# Patient Record
Sex: Male | Born: 1960 | Race: White | Hispanic: No | Marital: Married
Health system: Southern US, Community
[De-identification: ages and names within clinical notes are randomized; demographics above are authoritative.]

---

## 2004-12-02 ENCOUNTER — Ambulatory Visit (HOSPITAL_COMMUNITY): Admission: RE | Admit: 2004-12-02 | Discharge: 2004-12-03 | Payer: Self-pay | Admitting: Cardiology

## 2013-05-02 ENCOUNTER — Inpatient Hospital Stay: Payer: Self-pay | Admitting: Internal Medicine

## 2013-05-02 LAB — CBC
HGB: 15.3 g/dL (ref 13.0–18.0)
MCH: 28.9 pg (ref 26.0–34.0)
MCHC: 34.2 g/dL (ref 32.0–36.0)
MCV: 85 fL (ref 80–100)
Platelet: 229 10*3/uL (ref 150–440)
RDW: 13.7 % (ref 11.5–14.5)

## 2013-05-02 LAB — COMPREHENSIVE METABOLIC PANEL
Anion Gap: 6 — ABNORMAL LOW (ref 7–16)
BUN: 16 mg/dL (ref 7–18)
Bilirubin,Total: 0.3 mg/dL (ref 0.2–1.0)
Chloride: 108 mmol/L — ABNORMAL HIGH (ref 98–107)
Co2: 24 mmol/L (ref 21–32)
Creatinine: 0.76 mg/dL (ref 0.60–1.30)
Osmolality: 278 (ref 275–301)
Sodium: 138 mmol/L (ref 136–145)

## 2013-05-02 LAB — CK TOTAL AND CKMB (NOT AT ARMC)
CK, Total: 105 U/L (ref 35–232)
CK-MB: 2 ng/mL (ref 0.5–3.6)

## 2013-05-02 LAB — TROPONIN I
Troponin-I: <0.02 ng/mL
Troponin-I: <0.02 ng/mL

## 2013-05-02 LAB — APTT: Activated PTT: 45.1 secs — ABNORMAL HIGH (ref 23.6–35.9)

## 2013-05-02 LAB — PRO B NATRIURETIC PEPTIDE: B-Type Natriuretic Peptide: 135 pg/mL — ABNORMAL HIGH (ref 0–125)

## 2013-05-03 LAB — CBC WITH DIFFERENTIAL/PLATELET
Basophil %: 0.9 %
Eosinophil #: 0.3 10*3/uL (ref 0.0–0.7)
HCT: 40.2 % (ref 40.0–52.0)
MCH: 28.7 pg (ref 26.0–34.0)
MCV: 85 fL (ref 80–100)
Monocyte #: 0.5 x10 3/mm (ref 0.2–1.0)
Monocyte %: 6.7 %
Neutrophil %: 70.6 %
Platelet: 198 10*3/uL (ref 150–440)
RDW: 13.8 % (ref 11.5–14.5)

## 2013-05-03 LAB — TROPONIN I: Troponin-I: <0.02 ng/mL

## 2013-05-03 LAB — BASIC METABOLIC PANEL
BUN: 12 mg/dL (ref 7–18)
Calcium, Total: 8.5 mg/dL (ref 8.5–10.1)
Chloride: 106 mmol/L (ref 98–107)
Glucose: 109 mg/dL — ABNORMAL HIGH (ref 65–99)
Osmolality: 280 (ref 275–301)
Potassium: 4.1 mmol/L (ref 3.5–5.1)

## 2013-05-03 LAB — LIPID PANEL: Ldl Cholesterol, Calc: 63 mg/dL (ref 0–100)

## 2014-07-07 IMAGING — CR DG CHEST 2V
1 series · 2 of 2 positions shown · non-contrast
Comparison: none

REASON FOR EXAM: Chest Pain
COMMENTS:

[Series 1: pa · 0.17mm/px · 2 of 2 slices shown]
[im 1/2]
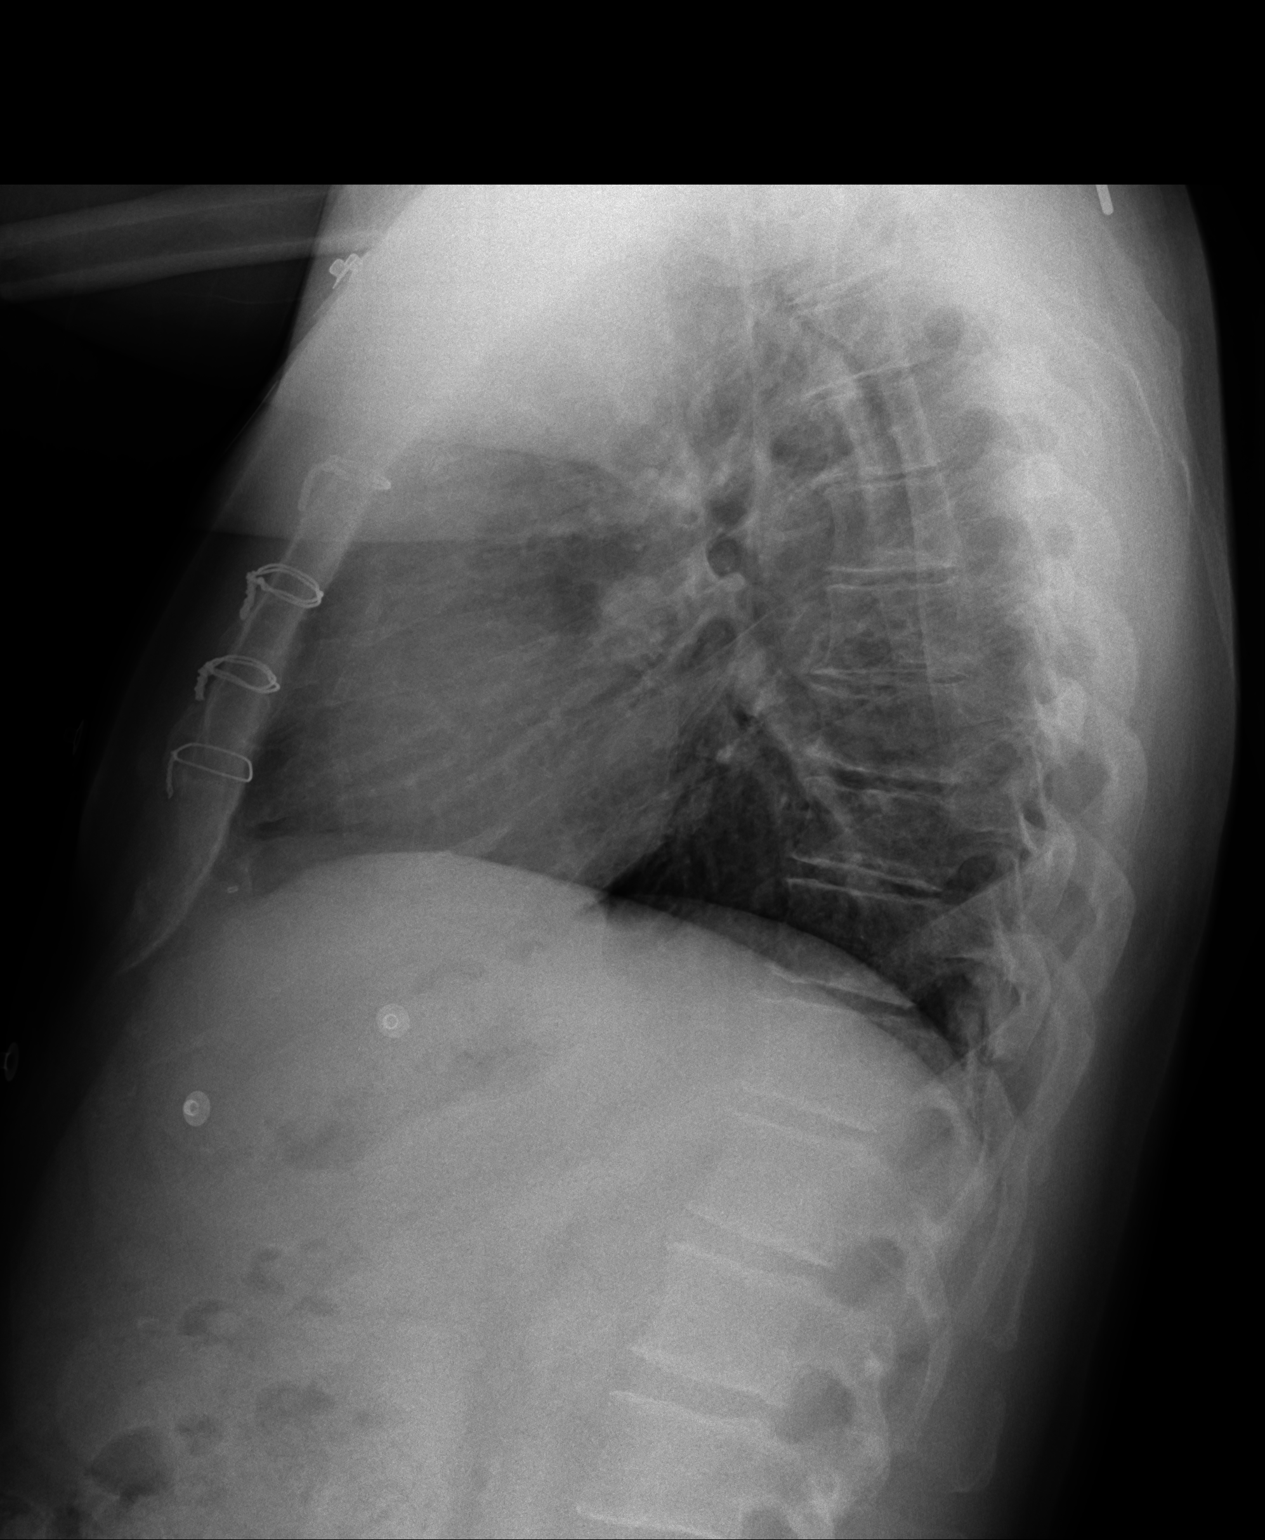
[im 2/2]
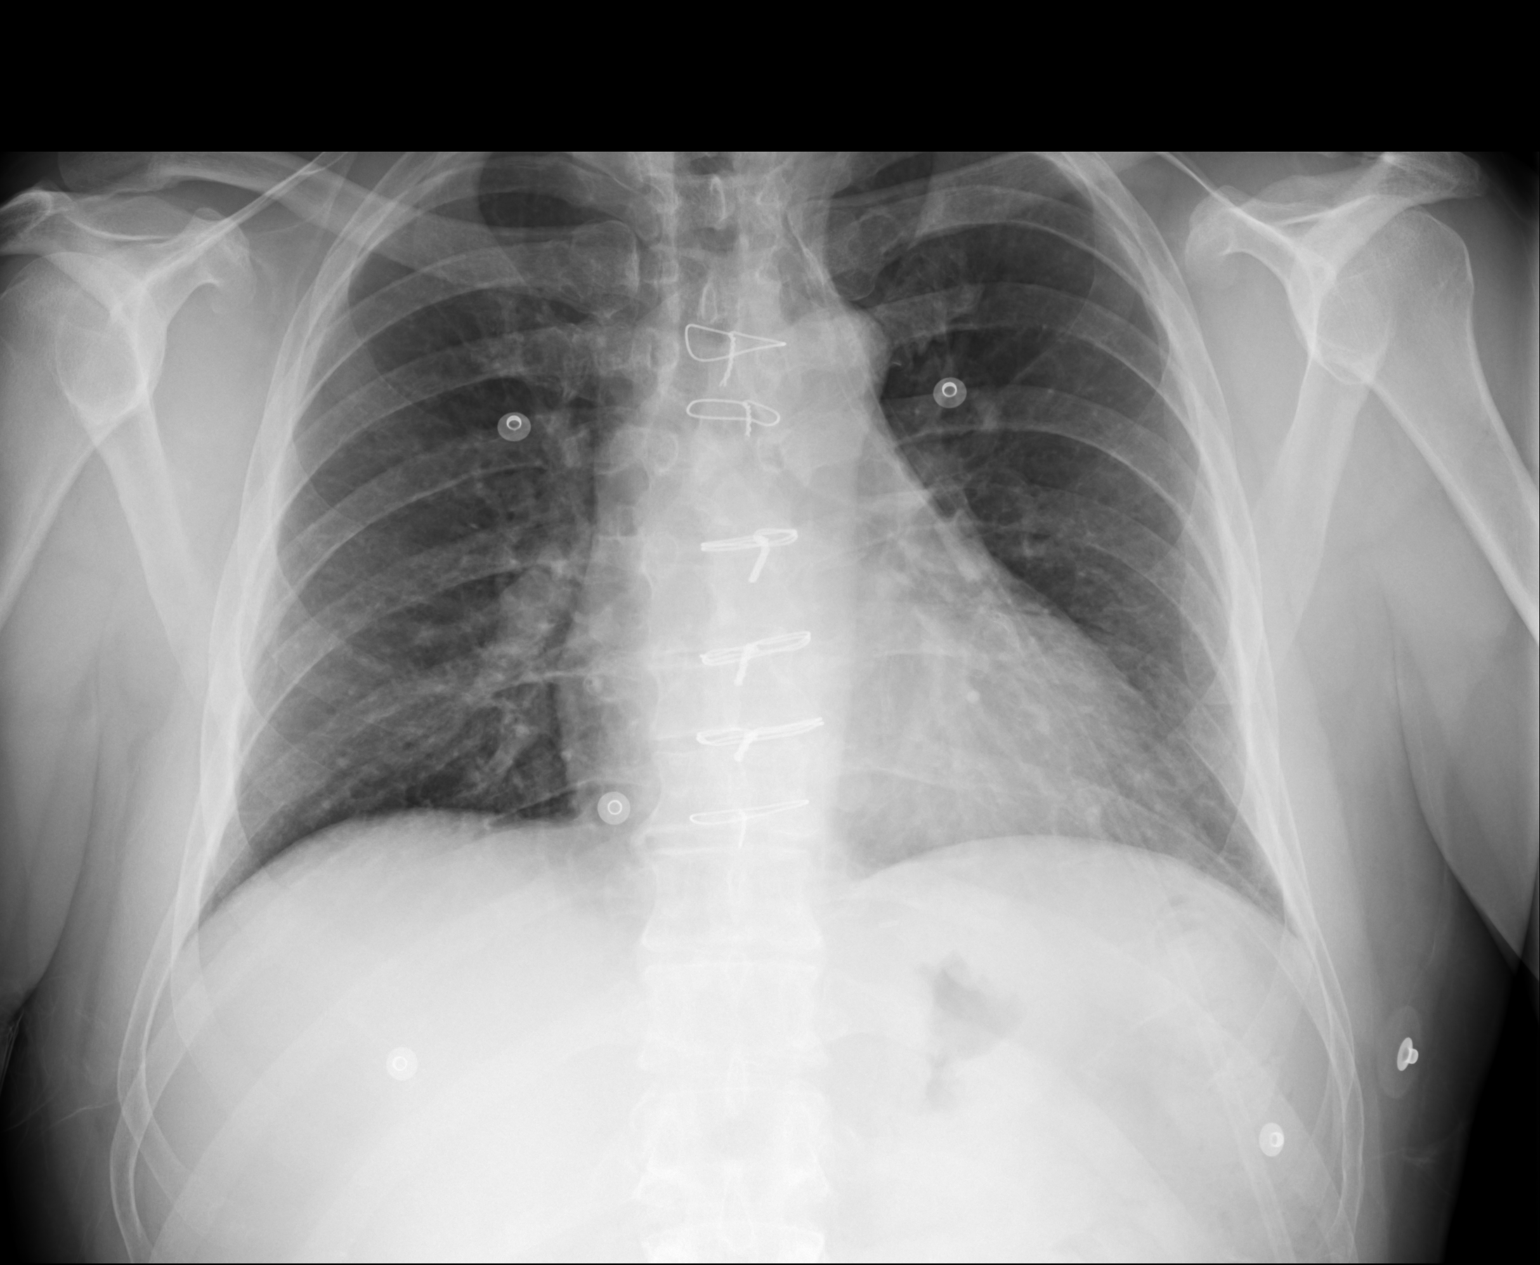

[2 of 2 positions shown; findings below may reference images not displayed]

PROCEDURE:     DXR - DXR CHEST PA (OR AP) AND LATERAL  - May 02, 2013 [DATE]

RESULT:     Sternotomy wires are present. The lungs are clear. The heart and
pulmonary vessels are normal. The bony and mediastinal structures are
unremarkable. There is no effusion. There is no pneumothorax or evidence of
congestive failure.
IMPRESSION: No acute cardiopulmonary disease.

[REDACTED]

## 2015-03-07 NOTE — Discharge Summary (Signed)
PATIENT NAME:  Micheal Patterson, Micheal Patterson MR#:  [id number] DATE OF BIRTH:  1961/06/01  DATE OF ADMISSION:  05/02/2013 DATE OF DISCHARGE:  05/03/2013  DISCHARGE DIAGNOSSS:  1.  Unable angina due to underlying coronary artery disease, now resolved.   2.  Coronary artery disease, status post cardiac catheterization with significant right coronary artery stenosis with drug-eluting stent placement of right coronary artery without any complications.    SECONDARY DIAGNOSES: 1.  Coronary artery disease.  2.  Hyperlipidemia.  3.  Diabetes.   CONSULTATION: Cardiology.   PROCEDURES/RADIOLOGY:  Chest x-ray on 18th of June showed no acute cardiopulmonary disease.  Cardiac catheterization on 18th of June showed known coronary artery disease with occluded graft to RCA, status post drug-eluting stent  in the 95% lesion of the distal RCA for intervention. There was 0% residue of stenosis. LIMA to LAD was patent. Graft to marginal was patent at the stent site. Left main had 80% stenosis.  New distal RCA stenosis was seen.   HISTORY AND SHORT HOSPITAL COURSE:  The patient is a 54 year old male with the above-mentioned medical problems who was admitted for unstable angina. Please see Dr. Mathews RobinsonsWieting's dictated  history and physical for further details. Cardiology consultation was obtained with Dr. Gwen PoundsKowalski, who recommended cardiac cath, considering his typical symptoms, which was performed, showed RCA stenosis and had a drug-eluting stent placed.  Post cath the patient was doing much better and was close to his baseline and was discharged home in stable condition.   PERTINENT DISCHARGE PHYSICAL EXAMINATION: VITAL SIGNS: On the date of discharge, his vital signs are as follows: Temperature 98.1, heart rate 56 per minute, respirations 18 per minute, blood pressure 100/55. He was saturating 97% on room air.  CARDIOVASCULAR: S1, S2 normal. No murmurs, rubs or gallops.  LUNGS: Clear to auscultation bilaterally. No wheezing, rales,  rhonchi or crepitation.  ABDOMEN: Soft, benign.  NEUROLOGIC: Nonfocal examination.  All other physical examination remained at baseline.   DISCHARGE MEDICATIONS: 1.  FEN 10 mg p.o. daily.  2.  Nexium 40 mg p.o. daily.  3.  Januvia 100 mg p.o. daily.  4.  Lisinopril 2.5 mg p.o. daily.  5.  Hyoscyamine 0.375 mg p.o. daily.  6.  Metformin 1000 mg p.o. b.i.d.  7.  Crestor 20 mg p.o. at bedtime.  8.  Fenofibrate acid 45 mg p.o. daily.  9.  Aspirin 81 mg p.o. daily.  10.  Metoprolol 12.5 mg p.o. daily.   DISCHARGE INSTRUCTIONS AND FOLLOWUP:   Diet:  Low sodium, low fat, low cholesterol.  Activity: As tolerated.  Follow up:  The patient was instructed to follow up with his primary care physician, Dr. Achilles Dunkope, in [place], [place] in 1 to 2 weeks.  He will need follow up with Dr. Renae FicklePaul at [facility] in [place], [place],  who is his regular cardiologist in 2 to 4 weeks.  He will also need followup with GI physician as planned for his EGD in 2 to 4 weeks.   TOTAL TIME DISCHARGING THIS PATIENT: 55 minutes.  ____________________________ Ellamae SiaVipul S. Sherryll BurgerShah, MD vss:cb D: 05/06/2013 15:15:28 ET T: 05/06/2013 20:20:01 ET JOB#: [id number] cc: Zitlaly Malson S. Sherryll BurgerShah, MD, <Dictator> Dr. Achilles [place]ope in [place], [place] Dr. Renae FicklePaul at [facility] in [place], [place] Lamar BlinksBruce J. Kowalski, MD Ellamae SiaVIPUL S [facility] MD ELECTRONICALLY SIGNED 05/07/2013 11:27

## 2015-03-07 NOTE — H&P (Signed)
PATIENT NAME:  Micheal Patterson, Micheal Patterson MR#:  [id number] DATE OF BIRTH:  1961-06-26  DATE OF ADMISSION:  05/02/2013  PRIMARY CARE PHYSICIAN:  Dr. Achilles Dunk in [place], [place]  CARDIOLOGIST:  Dr. Carter Kitten  in [facility] in [place], [place].   CHIEF COMPLAINT: Chest pain.   HISTORY OF PRESENT ILLNESS: This is a 54 year old man with coronary artery disease who presents with chest pain starting at 6:30 a.m. 7/10 in intensity in the center of his chest, radiating up into the right neck associated with diaphoresis. No nausea. No shortness of breath. The patient's chest pain was unrelenting and decided to call EMS. EMS gave 325 mg of aspirin and 2 nitroglycerin.  The pain did not relieve. They did an EKG which was concerning for a possible STEMI with ST elevations inferiorly. The patient arrived at the ER and EKG did not show a STEMI and the patient's initial troponin was negative. Hospitalist services were contacted for further evaluation. Of note, the patient did have a stent about a year ago and had a negative stress test last October. The patient is supposed to have an EGD on Monday for dysphagia to solids and liquids. The patient does not believe it is this type of chest pain related to his dysphagia.   PAST MEDICAL HISTORY: Coronary disease, hyperlipidemia, diabetes.   PAST SURGICAL HISTORY: CABG at age 62, stent 1 year ago.   ALLERGIES: PENICILLIN.   MEDICATIONS: Include: 1. Aspirin 325 mg daily. 2. Crestor 20 mg daily. 3. Effient 10 mg daily.  4. Enalapril 2.5 mg daily.  5. Fenofibrate acid 45 mg daily.  6. Hyoscamine  0.375 mg extended-release tablet daily. 7. Januvia 100 mg daily.  8. Metformin 1000 mg twice a day.  9. Metoprolol ER 25 mg daily.  10. Nexium 40 mg daily.   SOCIAL HISTORY: No smoking. No alcohol. No drug use. Works as a [employer].   FAMILY HISTORY: Father with hypertension, heart disease, atrial fibrillation and bladder cancer. Mother with diabetes and  esophageal stricture review of systems.   CONSTITUTIONAL: Positive for diarrhea, diaphoresis. No sweating positive for diarrhea, diaphoresis. No fever. No chills. Positive for weight loss 10 pounds past six months. Positive for fatigue.   EYES: He does wear glasses.   EARS, NOSE, MOUTH, AND THROAT: No hearing loss. No sore throat positive for dysphagia to liquids and solids.   CARDIOVASCULAR: Positive for chest pain. No palpitations.   RESPIRATORY: No shortness of breath. No coughing. No sputum. No hemoptysis.   GASTROINTESTINAL: No nausea. No vomiting. No abdominal pain. No diarrhea. No constipation. No bright red blood per rectum. No melena.   GENITOURINARY: No burning on urination or hematuria.   MUSCULOSKELETAL: Positive for knee, elbow and shoulder pain.   INTEGUMENT: No rashes or eruptions.   NEUROLOGIC: No fainting or blackouts.   PSYCHIATRIC: No anxiety or depression.   ENDOCRINE: No thyroid problems.   HEMATOLOGIC AND LYMPHATIC: No anemia.   PHYSICAL EXAMINATION: VITAL SIGNS: Temperature 98.9, pulse 47, respirations 20, blood pressure 105/55, pulse oximetry 97% on room air.   GENERAL: No respiratory distress.   EYES: Conjunctivae and lids normal. Pupils equal, round and reactive to light. Extraocular muscles intact. No nystagmus.   EARS, NOSE, MOUTH, AND THROAT: Tympanic membranes, no erythema. Nasal mucosa, no erythema.   THROAT: No erythema. No exudate seen. Lips and gums: No lesions.   NECK: No JVD. No bruits. No lymphadenopathy. No thyromegaly. No thyroid nodules palpated.   LUNGS: Clear to  auscultation. No use of accessory muscles to breathe. No rhonchi, rales or wheeze heard.   CARDIOVASCULAR: S1, S2 normal. No gallops, rubs or murmurs heard. Carotid upstroke 2+ bilaterally. No bruits. Dorsalis pedis as opposed to 2+ bilaterally. No edema of the lower extremity.   ABDOMEN: Soft, nontender. No organomegaly/splenomegaly. Normoactive bowel sounds. No masses  felt.   LYMPHATIC: No lymph nodes in the neck.   MUSCULOSKELETAL: No clubbing, edema or cyanosis.   SKIN: No rashes or ulcers seen.   NEUROLOGIC: Cranial nerves II through XII grossly intact. Deep tendon reflexes 2+ bilateral lower extremities.   PSYCHIATRIC: The patient is oriented to person, place and time.   LABORATORY AND RADIOLOGICAL DATA: EKG: By EMS was concerning for a possible STEMI inferiorly.  A repeat EKG here showed ST elevation only 1 mm in leads III and aVF. Unclear how much into it looks pretty flat, nonspecific ST or T wave changes. Troponin negative. CPK 105. White blood cell count 8.8, hemoglobin and hematocrit 15.3 and 44.5, platelet count of 229. Glucose 123, BUN 16, creatinine 0.76, sodium 139, potassium 4.9, chloride 108, CO2 24, calcium 9.5. Liver function tests normal range. BNP 135.   CHEST X-RAY: No acute cardiopulmonary disease.   ASSESSMENT AND PLAN: 1. Unstable angina with history of coronary artery disease and coronary artery bypass grafting with stents last year and abnormal EKG.  Initial EKG by EMS was concerning for STEMI but the repeat EKG was improved.  Only slight ST elevation in the inferior leads. Aspirin was given by EMS. We will continue aspirin, Effient, metoprolol. Start heparin drip. Her case discussed with Dr. Gwen PoundsKowalski to evaluate for cardiac catheter.  2. Hyperlipidemia on TriCor and Crestor. Check a lipid profile in the a.m.  3. Diabetes. Will hold Glucophage since consideration for cardiac catheter put her on a sliding scale. Continue Januvia.  4. Gastroesophageal reflux disease and dysphagia. Continue Nexium, was supposed to have an EGD on Monday. I will guaiac stools and check a hemoglobin.  This could be another etiology of patient's chest pain.  TIME SPENT ON ADMISSION: 55 minutes.   The patient will be admitted as an observation initially, continued monitoring needed.    ____________________________ Herschell Dimesichard J. Renae GlossWieting,  MD rjw:rw D: 05/02/2013 15:55:19 ET T: 05/02/2013 16:49:48 ET JOB#: [id number]  cc: Herschell Dimesichard J. Renae GlossWieting, MD, <Dictator> Madolyn FriezeBrian S. Achilles Dunkope, MD Outside Physician  Salley ScarletICHARD J Clarice Zulauf MD ELECTRONICALLY SIGNED 05/03/2013 13:19

## 2015-03-07 NOTE — Consult Note (Signed)
PATIENT NAME:  Micheal Patterson, Micheal Patterson MR#:  [id number] DATE OF BIRTH:  03-04-1961  DATE OF CONSULTATION:  05/02/2013  CONSULTING PHYSICIAN:  Lamar BlinksBruce J. Mael Delap, MD  REFERRING PHYSICIAN: Dr. Fonnie BirkenheadWhiting.   REASON FOR CONSULTATION: Unstable angina with known coronary artery disease, hypertension, hyperlipidemia and diabetes.   CHIEF COMPLAINT:  Chest pressure.   HISTORY OF PRESENT ILLNESS: This is a 54 year old male with known diabetes mellitus, hypertension, hyperlipidemia, and coronary artery disease status post previous coronary artery bypass graft with multiple previous myocardial infarction having previous multiple PCI and stent placements who has had substernal chest discomfort since 6:00 a.m., pressure and pain with an EKG consistent with spinous bradycardia and nonspecific ST changes, but no evidence of acute myocardial infarction. Troponin and CK-MB are normal at this time. The patient additionally has had some shortness of breath with these issues and currently is mildly short of breath, but not hypoxic.  Does have known diabetes under appropriate treatment with medications and high intensity cholesterol therapy as well. He remains on aspirin and Effient for coronary artery disease and multiple stents.    REVIEW OF SYSTEMS:  The remainder of review of systems is negative for weakness, fatigue, vision change, ringing in the ears, hearing loss, cough, congestion, heartburn, nausea, vomiting, diarrhea, bloody stools, stomach pain, extremity pain, leg weakness, cramping of the buttocks, known blood clots, headaches, blackouts, dizzy spells, nosebleeds, congestion, trouble swallowing, frequent urination, urination at night, muscle weakness, numbness, anxiety, depression, skin lesions, or skin rashes.   PAST MEDICAL HISTORY: 1. Coronary artery disease status post coronary artery bypass graft and stenting.  2. Hypertension.  3. Hyperlipidemia.  4. Diabetes.   FAMILY HISTORY: Multiple family members with early  onset of cardiovascular disease and known hypertension.   SOCIAL HISTORY: Currently denies alcohol or tobacco use.   ALLERGIES: AS LISTED.   MEDICATIONS: As listed.   PHYSICAL EXAMINATION: VITAL SIGNS: Blood pressure 110/68 bilaterally, heart rate is a 49 upright, reclining, and regular.   GENERAL: He is a well appearing male in no acute distress.   HEENT: No icterus, thyromegaly, ulcers, hemorrhage or xanthelasma.   CARDIOVASCULAR: Regular rate and rhythm with normal S1 and S2 without murmur, gallop or rub. PMI is inferiorly displaced. Carotid upstroke normal without bruit. Jugular venous pressure is normal.   LUNGS:  Bibasilar crackles with decreased breath sounds.   ABDOMEN: Soft, nontender without hepatosplenomegaly or masses. Abdominal aorta is normal size without bruit.   EXTREMITIES: 2+ radial, femoral, dorsal pedal pulses with no lower extremity edema, cyanosis, clubbing or ulcers.   NEUROLOGIC: He is oriented to time, place and person with normal mood and affect.   ASSESSMENT: A 54 year old male with coronary artery disease, hypertension, hyperlipidemia, diabetes, previous stenting and myocardial infarction having an acute episode of substernal chest pain consistent with unstable angina [place] class IV symptoms with bradycardia.   RECOMMENDATIONS: 1. Serial ECG and enzymes to assess for possible myocardial infarction.  2. Heparin, aspirin and caffeine for further risk reduction of myocardial infarction.  3. Continue beta blocker for heart rate control and follow for significant bradycardia.  4. Proceed to cardiac catheterization to assess coronary anatomy and further treatment thereof as necessary. The patient understands the risks and benefits of cardiac catheterization. This includes the possibility of death, stroke, heart attack, infection, bleeding or blood clot. He is at low risk for conscious sedation.    ____________________________ Lamar BlinksBruce J. Izzah Pasqua,  MD bjk:rw D: 05/02/2013 12:43:37 ET T: 05/02/2013 14:48:02 ET JOB#: [id number]  cc: Quentin CornwallBruce J.  Gwen Pounds, MD, <Dictator> Lamar Blinks MD ELECTRONICALLY SIGNED 05/10/2013 8:48
